# Patient Record
Sex: Female | Born: 2005 | Race: Black or African American | Hispanic: No | Marital: Single | Smoking: Never smoker
Health system: Southern US, Community
[De-identification: ages and names within clinical notes are randomized; demographics above are authoritative.]

## PROBLEM LIST (undated history)

## (undated) DIAGNOSIS — E669 Obesity, unspecified: Secondary | ICD-10-CM

## (undated) HISTORY — DX: Obesity, unspecified: E66.9

## (undated) HISTORY — PX: TONSILLECTOMY: SUR1361

---
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
## 2023-01-06 DIAGNOSIS — Z419 Encounter for procedure for purposes other than remedying health state, unspecified: Secondary | ICD-10-CM | POA: Diagnosis not present

## 2023-02-06 DIAGNOSIS — Z419 Encounter for procedure for purposes other than remedying health state, unspecified: Secondary | ICD-10-CM | POA: Diagnosis not present

## 2023-03-09 DIAGNOSIS — Z419 Encounter for procedure for purposes other than remedying health state, unspecified: Secondary | ICD-10-CM | POA: Diagnosis not present

## 2023-04-06 DIAGNOSIS — Z419 Encounter for procedure for purposes other than remedying health state, unspecified: Secondary | ICD-10-CM | POA: Diagnosis not present

## 2023-04-22 NOTE — Progress Notes (Deleted)
 Name: [name]  MRN/ DOB: [id number], 09/09/2005    Age/ Sex: 18 y.o., female    PCP: Aggie Hacker, MD   Reason for Endocrinology Evaluation: Prediabetes     Date of Initial Endocrinology Evaluation: 10/23/2022    HPI: [name] is a 18 y.o. female with a past medical history of pre-diabetes . The patient presented for initial endocrinology clinic visit on 10/23/2022 for consultative assistance with her prediabetes.   Patient has been diagnosed with prediabetes with an A1c of 5.8% in 2018 June pediatric evaluation.  She was then established with pediatric Endocrinology  She was noted with a rapid weight gain around 6 grade, as well as darkening of the skin around neck and armpits at age 44.  She was started on metformin in 03/2020 and  Ozempic 04/2020 but her initial visit to our clinic by 10/2022 she was not taking any.    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    Ozempic 0.5 mg weekly      HISTORY:  Past Medical History:  Past Medical History:  Diagnosis Date   Obesity    Past Surgical History:  Past Surgical History:  Procedure Laterality Date   TONSILLECTOMY      Social History:  reports that she has never smoked. She has never been exposed to tobacco smoke. She has never used smokeless tobacco. She reports current drug use. Drug: Marijuana. Family History: family history includes Diabetes in her maternal grandmother, paternal grandfather, and paternal grandmother; Hypertension in her maternal grandfather, maternal grandmother, paternal grandfather, and  paternal grandmother.   HOME MEDICATIONS: Allergies as of 04/23/2023   No Known Allergies      Medication List        Accurate as of April 22, 2023  2:02 PM. If you have any questions, ask your nurse or doctor.          etonogestrel 68 MG Impl implant Commonly known as: NEXPLANON 1 each by Subdermal route once.   Semaglutide(0.25 or 0.5MG /DOS) 2 MG/3ML Sopn Inject 0.5 mg into the skin once a week.          REVIEW OF SYSTEMS: A comprehensive ROS was conducted with the patient and is negative except as per HP   OBJECTIVE:  VS: There were no vitals taken for this visit.   Wt Readings from Last 3 Encounters:  10/23/22 (!) 307 lb (139.3 kg) (>99%, Z= 2.88)*  06/20/20 (!) 272 lb 12.8 oz (123.7 kg) (>99%, Z= 2.59)*  04/12/20 (!) 276 lb (125.2 kg) (>99%, Z= 2.61)*   * Growth percentiles are based on CDC (Girls, 2-20 Years) data.     EXAM: General: Pt appears well and is in NAD  Neck: General: Supple without adenopathy, acanthosis nigra cans noted Thyroid: Thyroid size normal.  No goiter or nodules appreciated. \  Lungs: Clear with good BS bilat   Heart: Auscultation: RRR.  Abdomen: Soft, nontender  Extremities:  BL LE: No pretibial edema   Mental Status: Judgment, insight: Intact Orientation:  Oriented to time, place, and person Mood and affect: No depression, anxiety, or agitation     DATA REVIEWED:  Latest Reference Range & Units 10/23/22 08:53  Sodium 135 - 145 mEq/L 138  Potassium 3.5 - 5.1 mEq/L 4.0  Chloride 96 - 112 mEq/L 105  CO2 19 - 32 mEq/L 26  Glucose 70 - 99 mg/dL 403 (H)  BUN 6 - 23 mg/dL 16  Creatinine 4.74 - 2.59 mg/dL 5.63  Calcium 8.4 - 87.5 mg/dL 8.9  Alkaline Phosphatase 47 - 119 U/L 60  Albumin 3.5 - 5.2 g/dL 3.9  AST 0 - 37 U/L 12  ALT 0 - 35 U/L 6  Total Protein 6.0 - 8.3 g/dL 7.0  Total Bilirubin 0.2 - 1.2 mg/dL 0.5  GFR >64.33 mL/min 127.43  Total CHOL/HDL Ratio  3  Cholesterol 0 - 200 mg/dL 295  HDL Cholesterol >18.84  mg/dL 16.60 (L)  LDL (calc) 0 - 99 mg/dL 82  NonHDL  63.01  Triglycerides 0.0 - 149.0 mg/dL 60.1  VLDL 0.0 - 09.3 mg/dL 23.5     Latest Reference Range & Units 10/23/22 08:53  Cortisol, Plasma ug/dL 9.2  Glucose 70 - 99 mg/dL 573 (H)  TSH 2.20 - 2.54 uIU/mL 1.08  T4,Free(Direct) 0.60 - 1.60 ng/dL 2.70  (H): Data is abnormally high     In office BG mg/dL    Old records , labs and images have been reviewed.    ASSESSMENT/PLAN/RECOMMENDATIONS:   Impaired fasting glucose :   - A1c 6.1%  -She was on metformin, Ozempic as well as Byetta without any side effects  Medications : Start Ozempic 0.25 mg weekly for 6 weeks, then increase to 0.5 mg weekly  2.  Weight gain:  -Patient has gained 34 LB's over the past 2 years -Discussed importance of lifestyle changes -Will proceed with dexamethasone suppression test for Cushing syndrome screening -Serum cortisol is normal, ACTH pending    Signed electronically by: Lyndle Herrlich, MD  [facility]  [facility] [address] [place], [place] [address] Phone: [phone] FAX: [phone]   CC: Aggie Hacker, MD [address] [place] [address] Phone: [phone] Fax: [phone]   Return to Endocrinology clinic as below: Future Appointments  Date Time Provider Department Center  04/23/2023  7:30 AM Joesph Marcy, Konrad Dolores, MD [facility] None

## 2023-04-23 ENCOUNTER — Ambulatory Visit: Payer: Medicaid Other | Admitting: Internal Medicine

## 2023-05-18 DIAGNOSIS — Z419 Encounter for procedure for purposes other than remedying health state, unspecified: Secondary | ICD-10-CM | POA: Diagnosis not present

## 2023-05-30 DIAGNOSIS — N760 Acute vaginitis: Secondary | ICD-10-CM | POA: Diagnosis not present

## 2023-05-30 DIAGNOSIS — B9689 Other specified bacterial agents as the cause of diseases classified elsewhere: Secondary | ICD-10-CM | POA: Diagnosis not present

## 2023-05-30 DIAGNOSIS — Z113 Encounter for screening for infections with a predominantly sexual mode of transmission: Secondary | ICD-10-CM | POA: Diagnosis not present

## 2023-05-30 DIAGNOSIS — N898 Other specified noninflammatory disorders of vagina: Secondary | ICD-10-CM | POA: Diagnosis not present

## 2023-06-17 DIAGNOSIS — Z419 Encounter for procedure for purposes other than remedying health state, unspecified: Secondary | ICD-10-CM | POA: Diagnosis not present

## 2023-06-19 DIAGNOSIS — Z30017 Encounter for initial prescription of implantable subdermal contraceptive: Secondary | ICD-10-CM | POA: Diagnosis not present

## 2023-06-19 DIAGNOSIS — Z3046 Encounter for surveillance of implantable subdermal contraceptive: Secondary | ICD-10-CM | POA: Diagnosis not present

## 2023-06-19 DIAGNOSIS — Z3202 Encounter for pregnancy test, result negative: Secondary | ICD-10-CM | POA: Diagnosis not present

## 2023-07-18 DIAGNOSIS — Z419 Encounter for procedure for purposes other than remedying health state, unspecified: Secondary | ICD-10-CM | POA: Diagnosis not present

## 2023-08-17 DIAGNOSIS — Z419 Encounter for procedure for purposes other than remedying health state, unspecified: Secondary | ICD-10-CM | POA: Diagnosis not present

## 2023-08-29 DIAGNOSIS — F3181 Bipolar II disorder: Secondary | ICD-10-CM | POA: Diagnosis not present

## 2023-09-17 DIAGNOSIS — Z419 Encounter for procedure for purposes other than remedying health state, unspecified: Secondary | ICD-10-CM | POA: Diagnosis not present

## 2023-09-19 DIAGNOSIS — F3181 Bipolar II disorder: Secondary | ICD-10-CM | POA: Diagnosis not present

## 2023-10-03 DIAGNOSIS — F3181 Bipolar II disorder: Secondary | ICD-10-CM | POA: Diagnosis not present

## 2023-10-10 DIAGNOSIS — F3181 Bipolar II disorder: Secondary | ICD-10-CM | POA: Diagnosis not present

## 2023-10-17 DIAGNOSIS — F3181 Bipolar II disorder: Secondary | ICD-10-CM | POA: Diagnosis not present

## 2023-10-18 DIAGNOSIS — Z419 Encounter for procedure for purposes other than remedying health state, unspecified: Secondary | ICD-10-CM | POA: Diagnosis not present

## 2023-11-07 DIAGNOSIS — F3181 Bipolar II disorder: Secondary | ICD-10-CM | POA: Diagnosis not present

## 2023-11-14 DIAGNOSIS — F3181 Bipolar II disorder: Secondary | ICD-10-CM | POA: Diagnosis not present

## 2023-11-28 DIAGNOSIS — F3181 Bipolar II disorder: Secondary | ICD-10-CM | POA: Diagnosis not present

## 2023-12-12 DIAGNOSIS — F3181 Bipolar II disorder: Secondary | ICD-10-CM | POA: Diagnosis not present

## 2023-12-26 DIAGNOSIS — F3181 Bipolar II disorder: Secondary | ICD-10-CM | POA: Diagnosis not present

## 2024-01-23 DIAGNOSIS — F3181 Bipolar II disorder: Secondary | ICD-10-CM | POA: Diagnosis not present
